# Patient Record
Sex: Female | Born: 1960 | Race: White | Hispanic: No | Marital: Single
Health system: Southern US, Community
[De-identification: ages and names within clinical notes are randomized; demographics above are authoritative.]

## PROBLEM LIST (undated history)

## (undated) DIAGNOSIS — R760 Raised antibody titer: Secondary | ICD-10-CM

## (undated) DIAGNOSIS — Z9889 Other specified postprocedural states: Secondary | ICD-10-CM

## (undated) DIAGNOSIS — K760 Fatty (change of) liver, not elsewhere classified: Secondary | ICD-10-CM

## (undated) DIAGNOSIS — R600 Localized edema: Secondary | ICD-10-CM

## (undated) DIAGNOSIS — I10 Essential (primary) hypertension: Secondary | ICD-10-CM

## (undated) DIAGNOSIS — R112 Nausea with vomiting, unspecified: Secondary | ICD-10-CM

## (undated) DIAGNOSIS — D179 Benign lipomatous neoplasm, unspecified: Secondary | ICD-10-CM

## (undated) DIAGNOSIS — E7800 Pure hypercholesterolemia, unspecified: Secondary | ICD-10-CM

## (undated) DIAGNOSIS — C801 Malignant (primary) neoplasm, unspecified: Secondary | ICD-10-CM

## (undated) DIAGNOSIS — K219 Gastro-esophageal reflux disease without esophagitis: Secondary | ICD-10-CM

## (undated) HISTORY — PX: ABDOMINAL HYSTERECTOMY: SHX81

## (undated) HISTORY — PX: BREAST EXCISIONAL BIOPSY: SUR124

## (undated) HISTORY — PX: CATARACT EXTRACTION: SUR2

---

## 2010-05-12 ENCOUNTER — Ambulatory Visit: Payer: Self-pay | Admitting: Family Medicine

## 2010-12-28 ENCOUNTER — Ambulatory Visit: Payer: Self-pay | Admitting: Family Medicine

## 2012-12-17 ENCOUNTER — Ambulatory Visit: Payer: Self-pay | Admitting: Family Medicine

## 2013-12-11 ENCOUNTER — Ambulatory Visit: Payer: Self-pay | Admitting: Family Medicine

## 2015-01-14 ENCOUNTER — Ambulatory Visit
Admit: 2015-01-14 | Disposition: A | Discharge status: Home / Self Care | Payer: Self-pay | Attending: Family Medicine | Admitting: Family Medicine

## 2015-09-02 ENCOUNTER — Other Ambulatory Visit: Payer: Self-pay | Admitting: Family Medicine

## 2015-09-02 DIAGNOSIS — Z1231 Encounter for screening mammogram for malignant neoplasm of breast: Secondary | ICD-10-CM

## 2015-09-15 ENCOUNTER — Ambulatory Visit
Admission: RE | Admit: 2015-09-15 | Discharge: 2015-09-15 | Disposition: A | Discharge status: Home / Self Care | Payer: BLUE CROSS/BLUE SHIELD | Source: Ambulatory Visit | Attending: Family Medicine | Admitting: Family Medicine

## 2015-09-15 DIAGNOSIS — Z1231 Encounter for screening mammogram for malignant neoplasm of breast: Secondary | ICD-10-CM | POA: Insufficient documentation

## 2015-09-15 HISTORY — DX: Malignant (primary) neoplasm, unspecified: C80.1

## 2017-05-02 ENCOUNTER — Other Ambulatory Visit: Payer: Self-pay | Admitting: Family Medicine

## 2017-05-02 DIAGNOSIS — Z1231 Encounter for screening mammogram for malignant neoplasm of breast: Secondary | ICD-10-CM

## 2017-05-16 ENCOUNTER — Ambulatory Visit
Admission: RE | Admit: 2017-05-16 | Discharge: 2017-05-16 | Disposition: A | Discharge status: Home / Self Care | Payer: BLUE CROSS/BLUE SHIELD | Source: Ambulatory Visit | Attending: Family Medicine | Admitting: Family Medicine

## 2017-05-16 DIAGNOSIS — Z1231 Encounter for screening mammogram for malignant neoplasm of breast: Secondary | ICD-10-CM | POA: Diagnosis present

## 2018-10-16 ENCOUNTER — Other Ambulatory Visit: Payer: Self-pay | Admitting: Family Medicine

## 2018-10-16 DIAGNOSIS — Z1231 Encounter for screening mammogram for malignant neoplasm of breast: Secondary | ICD-10-CM

## 2018-11-06 ENCOUNTER — Ambulatory Visit
Admission: RE | Admit: 2018-11-06 | Discharge: 2018-11-06 | Disposition: A | Discharge status: Home / Self Care | Payer: BLUE CROSS/BLUE SHIELD | Source: Ambulatory Visit | Attending: Family Medicine | Admitting: Family Medicine

## 2018-11-06 DIAGNOSIS — Z1231 Encounter for screening mammogram for malignant neoplasm of breast: Secondary | ICD-10-CM | POA: Insufficient documentation

## 2019-11-12 ENCOUNTER — Other Ambulatory Visit: Payer: Self-pay | Admitting: Family Medicine

## 2019-11-12 DIAGNOSIS — Z1231 Encounter for screening mammogram for malignant neoplasm of breast: Secondary | ICD-10-CM

## 2019-11-28 ENCOUNTER — Ambulatory Visit
Admission: RE | Admit: 2019-11-28 | Discharge: 2019-11-28 | Disposition: A | Discharge status: Home / Self Care | Payer: BC Managed Care – PPO | Source: Ambulatory Visit | Attending: Family Medicine | Admitting: Family Medicine

## 2019-11-28 ENCOUNTER — Other Ambulatory Visit: Payer: Self-pay

## 2019-11-28 DIAGNOSIS — Z1231 Encounter for screening mammogram for malignant neoplasm of breast: Secondary | ICD-10-CM | POA: Diagnosis present

## 2020-01-08 ENCOUNTER — Ambulatory Visit: Payer: BC Managed Care – PPO | Attending: Internal Medicine

## 2020-01-08 DIAGNOSIS — Z23 Encounter for immunization: Secondary | ICD-10-CM

## 2020-01-08 NOTE — Progress Notes (Signed)
   Covid-19 Vaccination Clinic  Name:  Breylynn Haid    MRN: [id number] DOB: 01-19-1961  01/08/2020  [name] was observed post Covid-19 immunization for 15 minutes without incident. She was provided with Vaccine Information Sheet and instruction to access the V-Safe system.   [name] was instructed to call 911 with any severe reactions post vaccine: Marland Kitchen Difficulty breathing  . Swelling of face and throat  . A fast heartbeat  . A bad rash all over body  . Dizziness and weakness   Immunizations Administered    Name Date Dose VIS Date Route   Moderna COVID-19 Vaccine 01/08/2020  2:29 PM 0.5 mL 09/03/2019 Intramuscular   Manufacturer: Moderna   Lot: [id number]   [id number]

## 2020-02-05 ENCOUNTER — Ambulatory Visit: Payer: Self-pay | Attending: Internal Medicine

## 2020-02-05 DIAGNOSIS — Z23 Encounter for immunization: Secondary | ICD-10-CM

## 2020-02-05 NOTE — Progress Notes (Signed)
   Covid-19 Vaccination Clinic  Name:  Misty Mcknight    MRN: [id number] DOB: 06-16-1961  02/05/2020  [name] was observed post Covid-19 immunization for 15 minutes without incident. She was provided with Vaccine Information Sheet and instruction to access the V-Safe system.   [name] was instructed to call 911 with any severe reactions post vaccine: Marland Kitchen Difficulty breathing  . Swelling of face and throat  . A fast heartbeat  . A bad rash all over body  . Dizziness and weakness   Immunizations Administered    Name Date Dose VIS Date Route   Moderna COVID-19 Vaccine 02/05/2020 11:12 AM 0.5 mL 09/2019 Intramuscular   Manufacturer: Moderna   Lot: [id number]   [id number]

## 2020-10-28 ENCOUNTER — Other Ambulatory Visit: Payer: Self-pay | Admitting: Internal Medicine

## 2020-11-20 ENCOUNTER — Other Ambulatory Visit: Payer: Self-pay | Admitting: Family Medicine

## 2020-11-20 DIAGNOSIS — Z1231 Encounter for screening mammogram for malignant neoplasm of breast: Secondary | ICD-10-CM

## 2020-12-16 ENCOUNTER — Other Ambulatory Visit: Payer: Self-pay

## 2020-12-16 ENCOUNTER — Ambulatory Visit
Admission: RE | Admit: 2020-12-16 | Discharge: 2020-12-16 | Disposition: A | Discharge status: Home / Self Care | Payer: BC Managed Care – PPO | Source: Ambulatory Visit | Attending: Family Medicine | Admitting: Family Medicine

## 2020-12-16 DIAGNOSIS — Z1231 Encounter for screening mammogram for malignant neoplasm of breast: Secondary | ICD-10-CM | POA: Insufficient documentation

## 2021-05-02 IMAGING — MG MM DIGITAL SCREENING BILAT W/ TOMO AND CAD
8 series · 9 of 24 positions shown · non-contrast
Comparison: Previous exam(s).

CLINICAL DATA: Screening.

EXAM:
DIGITAL SCREENING BILATERAL MAMMOGRAM WITH TOMOSYNTHESIS AND CAD
TECHNIQUE: Bilateral screening digital craniocaudal and mediolateral oblique
mammograms were obtained. Bilateral screening digital breast
tomosynthesis was performed. The images were evaluated with
computer-aided detection.

[R MLO synth-2D]
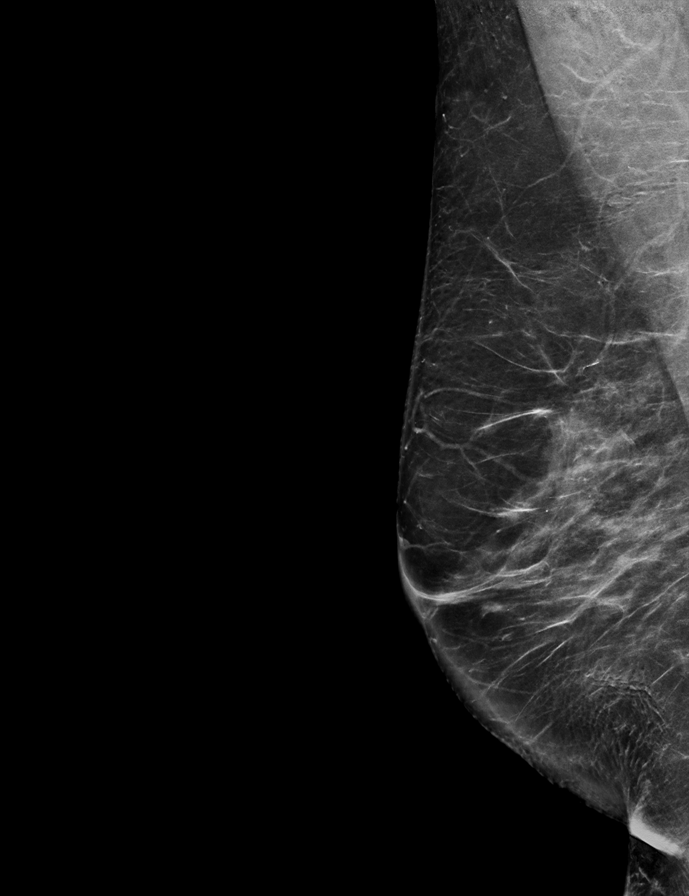

[R CC synth-2D]
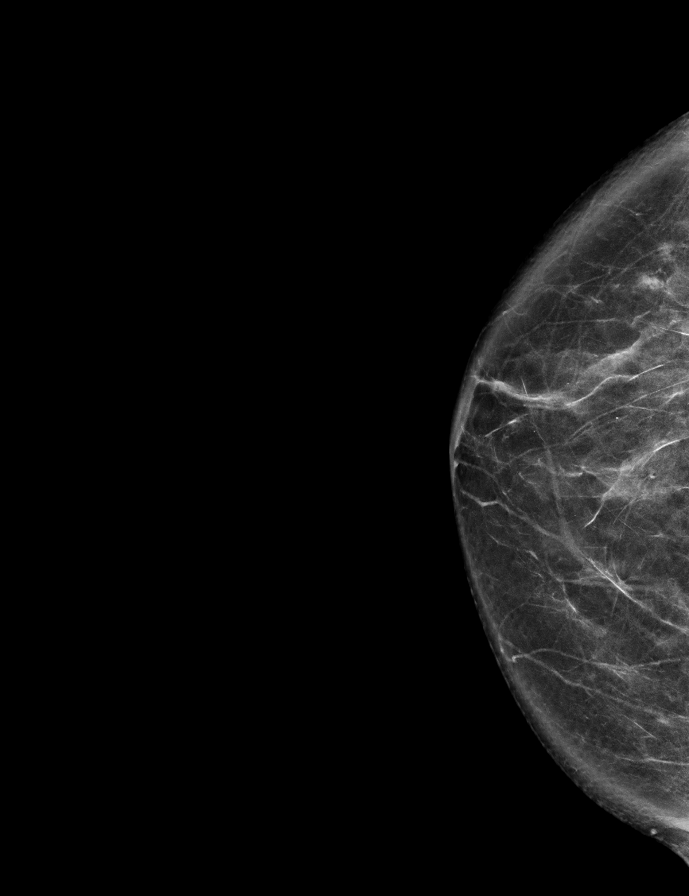

[L MLO synth-2D]
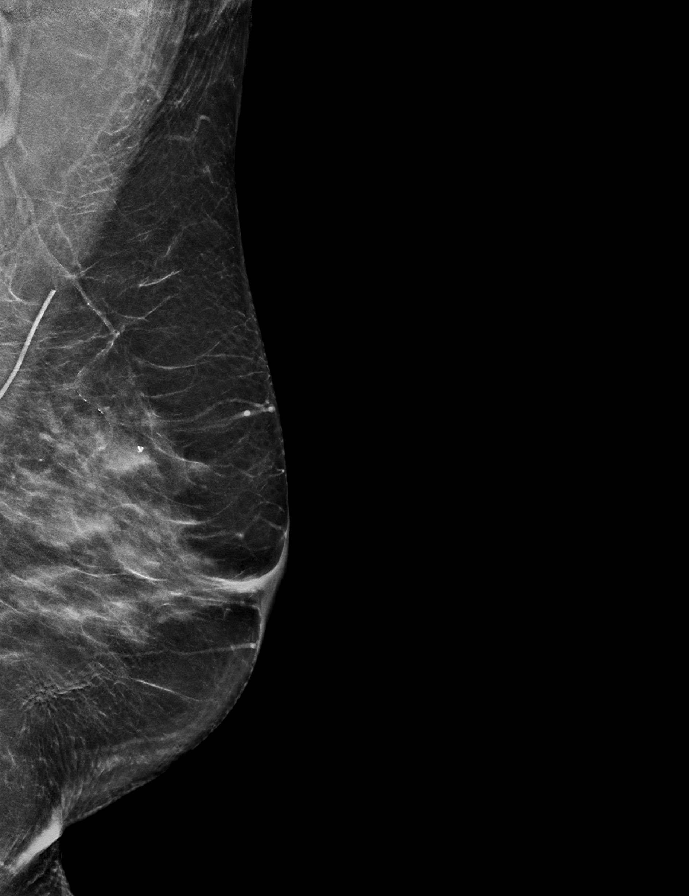

[L CC synth-2D]
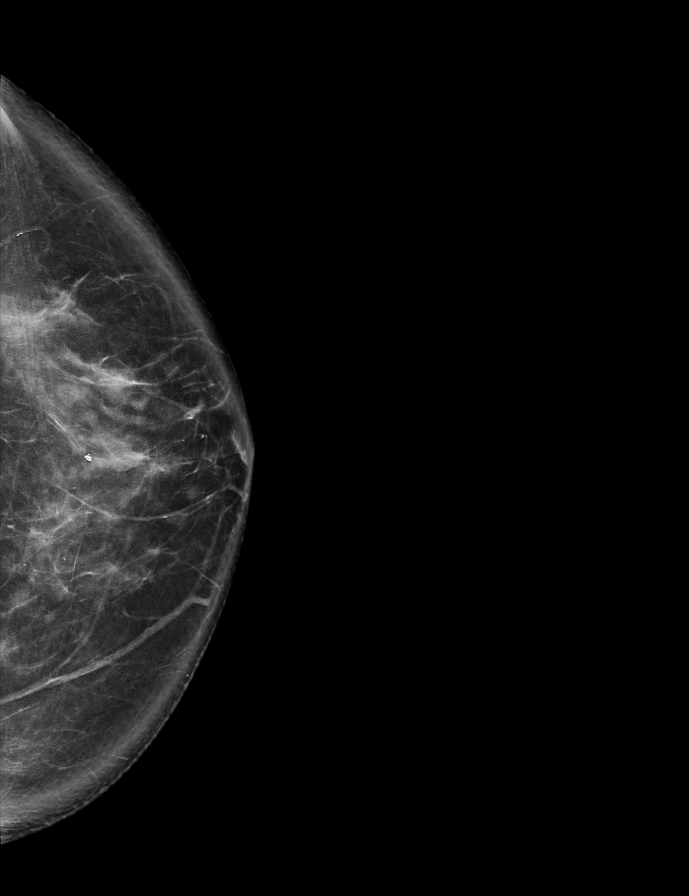

[R CC tomo · 2 of 77 frames shown]
[frame 25/77]
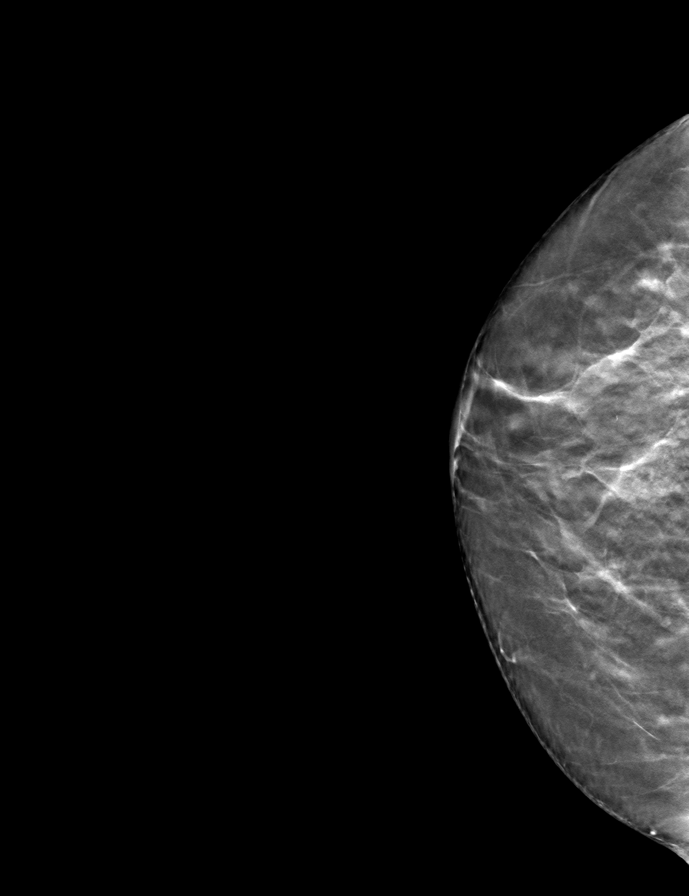
[frame 39/77]
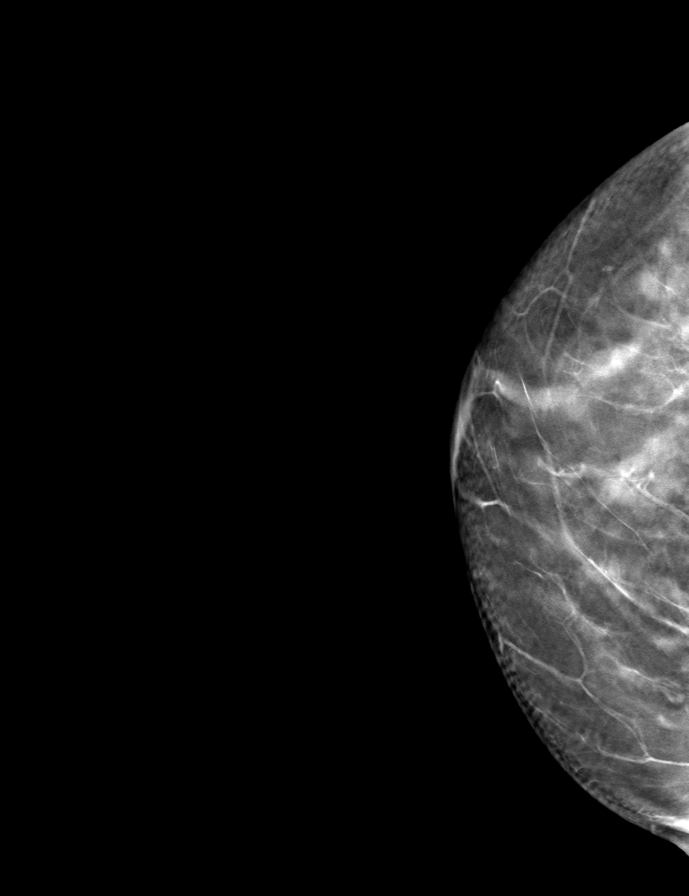

[R MLO tomo · tomo slice 40/79.0]
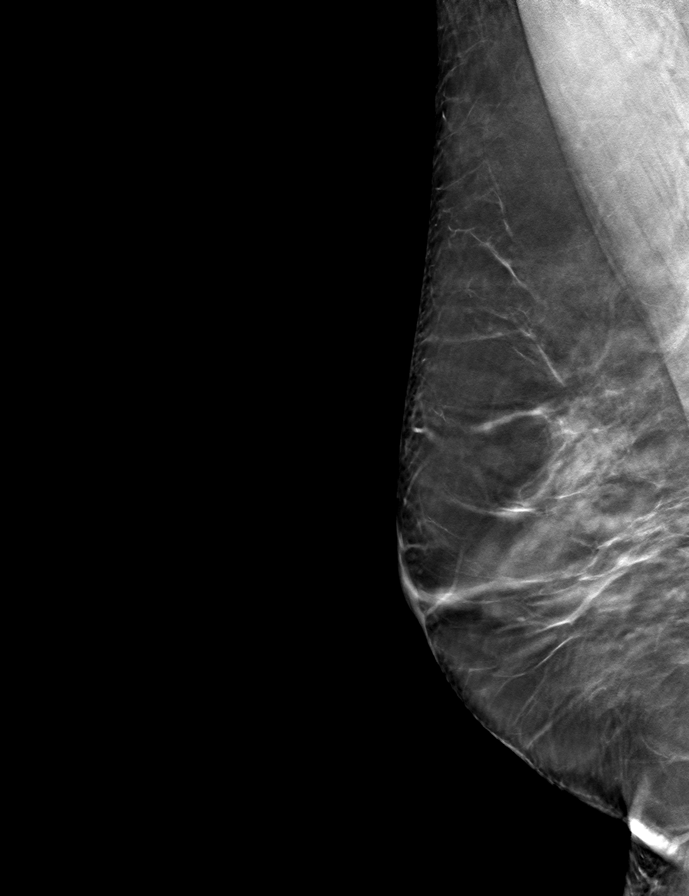

[L CC tomo · tomo slice 40/79.0]
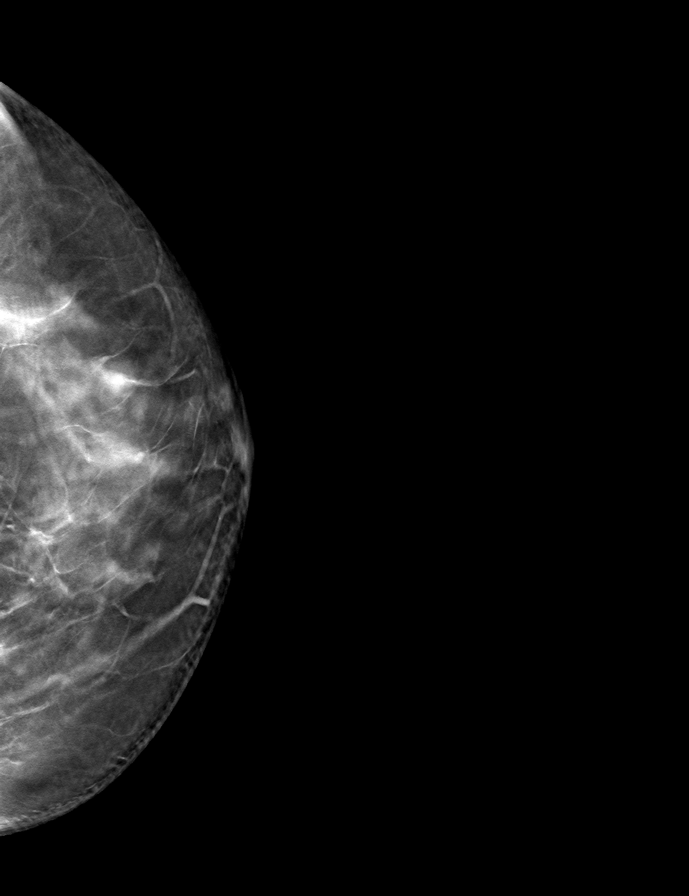

[L MLO tomo · tomo slice 33/66.0]
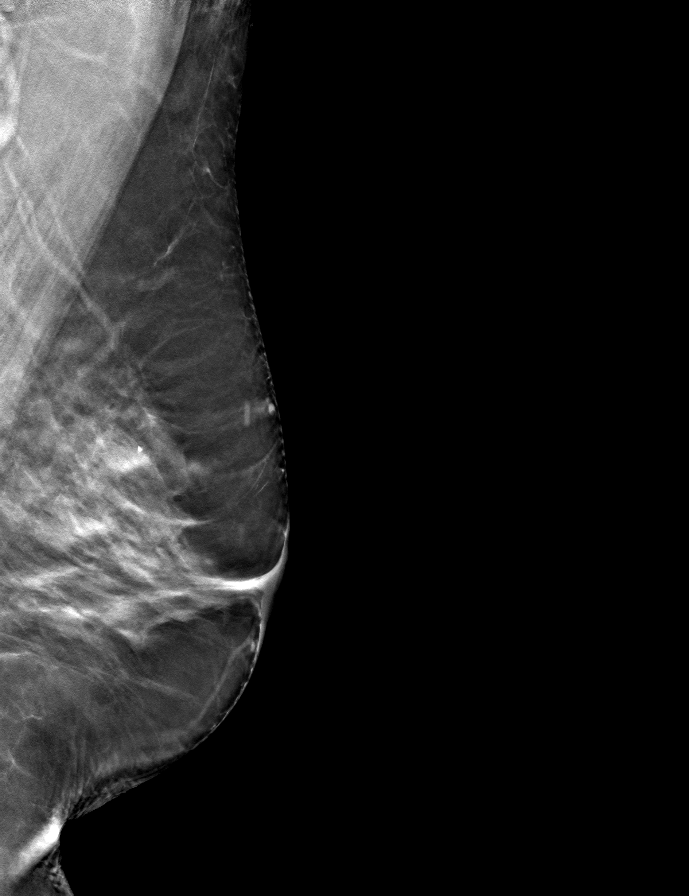

[9 of 24 positions shown; findings below may reference images not displayed]

ACR Breast Density Category c: The breast tissue is heterogeneously
dense, which may obscure small masses.
FINDINGS: There are no findings suspicious for malignancy. The images were
evaluated with computer-aided detection.
IMPRESSION: No mammographic evidence of malignancy. A result letter of this
screening mammogram will be mailed directly to the patient.

RECOMMENDATION:
Screening mammogram in one year. (Code:T4-5-GWO)

BI-RADS CATEGORY  1: Negative.

## 2021-12-14 ENCOUNTER — Other Ambulatory Visit: Payer: Self-pay | Admitting: Family Medicine

## 2021-12-14 DIAGNOSIS — Z1231 Encounter for screening mammogram for malignant neoplasm of breast: Secondary | ICD-10-CM

## 2021-12-27 ENCOUNTER — Encounter: Payer: Self-pay | Admitting: Otolaryngology

## 2022-01-06 ENCOUNTER — Ambulatory Visit: Payer: BC Managed Care – PPO | Admitting: Anesthesiology

## 2022-01-06 ENCOUNTER — Encounter
Admission: RE | Disposition: A | Discharge status: Home / Self Care | Payer: Self-pay | Source: Home / Self Care | Attending: Otolaryngology

## 2022-01-06 ENCOUNTER — Encounter: Payer: Self-pay | Admitting: Otolaryngology

## 2022-01-06 ENCOUNTER — Ambulatory Visit
Admission: RE | Admit: 2022-01-06 | Discharge: 2022-01-06 | Disposition: A | Discharge status: Home / Self Care | Payer: BC Managed Care – PPO | Attending: Otolaryngology | Admitting: Otolaryngology

## 2022-01-06 ENCOUNTER — Other Ambulatory Visit: Payer: Self-pay

## 2022-01-06 DIAGNOSIS — D751 Secondary polycythemia: Secondary | ICD-10-CM | POA: Diagnosis not present

## 2022-01-06 DIAGNOSIS — D2322 Other benign neoplasm of skin of left ear and external auricular canal: Secondary | ICD-10-CM | POA: Insufficient documentation

## 2022-01-06 DIAGNOSIS — I10 Essential (primary) hypertension: Secondary | ICD-10-CM | POA: Diagnosis not present

## 2022-01-06 DIAGNOSIS — H938X2 Other specified disorders of left ear: Secondary | ICD-10-CM | POA: Diagnosis present

## 2022-01-06 HISTORY — DX: Nausea with vomiting, unspecified: R11.2

## 2022-01-06 HISTORY — DX: Raised antibody titer: R76.0

## 2022-01-06 HISTORY — DX: Fatty (change of) liver, not elsewhere classified: K76.0

## 2022-01-06 HISTORY — DX: Gastro-esophageal reflux disease without esophagitis: K21.9

## 2022-01-06 HISTORY — DX: Other specified postprocedural states: Z98.890

## 2022-01-06 HISTORY — DX: Localized edema: R60.0

## 2022-01-06 HISTORY — PX: MINOR EXCISION EAR CANAL MASS: SHX6241

## 2022-01-06 HISTORY — DX: Essential (primary) hypertension: I10

## 2022-01-06 HISTORY — DX: Pure hypercholesterolemia, unspecified: E78.00

## 2022-01-06 HISTORY — DX: Benign lipomatous neoplasm, unspecified: D17.9

## 2022-01-06 SURGERY — MINOR EXCISION EAR CANAL MASS
Anesthesia: General | Site: Ear | Laterality: Left

## 2022-01-06 MED ORDER — DEXAMETHASONE SODIUM PHOSPHATE 10 MG/ML IJ SOLN
INTRAMUSCULAR | Status: DC | PRN
  Administered 2022-01-06: 4 mg via INTRAVENOUS

## 2022-01-06 MED ORDER — MIDAZOLAM HCL 2 MG/2ML IJ SOLN
INTRAMUSCULAR | Status: DC | PRN
  Administered 2022-01-06: 2 mg via INTRAVENOUS

## 2022-01-06 MED ORDER — LIDOCAINE HCL (CARDIAC) PF 100 MG/5ML IV SOSY
PREFILLED_SYRINGE | INTRAVENOUS | Status: DC | PRN
  Administered 2022-01-06: 50 mg via INTRAVENOUS

## 2022-01-06 MED ORDER — LACTATED RINGERS IV SOLN: INTRAVENOUS | Status: DC

## 2022-01-06 MED ORDER — OXYCODONE HCL 5 MG PO TABS: 5.0000 mg | ORAL_TABLET | Freq: Once | ORAL | Status: DC | PRN

## 2022-01-06 MED ORDER — HYDROMORPHONE HCL 1 MG/ML IJ SOLN: 0.2500 mg | INTRAMUSCULAR | Status: DC | PRN

## 2022-01-06 MED ORDER — LIDOCAINE-EPINEPHRINE 1 %-1:100000 IJ SOLN
INTRAMUSCULAR | Status: DC | PRN
  Administered 2022-01-06: .5 mL

## 2022-01-06 MED ORDER — ONDANSETRON HCL 4 MG/2ML IJ SOLN
INTRAMUSCULAR | Status: DC | PRN
  Administered 2022-01-06: 4 mg via INTRAVENOUS

## 2022-01-06 MED ORDER — HYDROCODONE-ACETAMINOPHEN 5-325 MG PO TABS: 1.0000 | ORAL_TABLET | Freq: Four times a day (QID) | ORAL | 0 refills | Status: AC | PRN

## 2022-01-06 MED ORDER — OXYCODONE HCL 5 MG/5ML PO SOLN: 5.0000 mg | Freq: Once | ORAL | Status: DC | PRN

## 2022-01-06 MED ORDER — FENTANYL CITRATE (PF) 100 MCG/2ML IJ SOLN
INTRAMUSCULAR | Status: DC | PRN
  Administered 2022-01-06: 50 ug via INTRAVENOUS

## 2022-01-06 MED ORDER — PROPOFOL 10 MG/ML IV BOLUS
INTRAVENOUS | Status: DC | PRN
  Administered 2022-01-06 (×2): 20 mg via INTRAVENOUS
  Administered 2022-01-06: 60 mg via INTRAVENOUS
  Administered 2022-01-06 (×4): 20 mg via INTRAVENOUS

## 2022-01-06 SURGICAL SUPPLY — 31 items
BALL CTTN LRG ABS STRL LF (GAUZE/BANDAGES/DRESSINGS) ×1
BLADE SURG 15 STRL LF DISP TIS (BLADE) IMPLANT
BLADE SURG 15 STRL SS (BLADE)
CORD BIP STRL DISP 12FT (MISCELLANEOUS) IMPLANT
COTTONBALL LRG STERILE PKG (GAUZE/BANDAGES/DRESSINGS) ×1 IMPLANT
DRSG EMULSION OIL 3X3 NADH (GAUZE/BANDAGES/DRESSINGS) ×1 IMPLANT
DRSG TELFA 4X3 1S NADH ST (GAUZE/BANDAGES/DRESSINGS) IMPLANT
ELECT CAUTERY BLADE TIP 2.5 (TIP)
ELECT REM PT RETURN 9FT ADLT (ELECTROSURGICAL) ×2
ELECTRODE CAUTERY BLDE TIP 2.5 (TIP) IMPLANT
ELECTRODE REM PT RTRN 9FT ADLT (ELECTROSURGICAL) ×1 IMPLANT
GAUZE SPONGE 4X4 12PLY STRL (GAUZE/BANDAGES/DRESSINGS) IMPLANT
GLOVE SURG GAMMEX PI TX LF 7.5 (GLOVE) ×4 IMPLANT
GOWN STRL REUS W/ TWL LRG LVL3 (GOWN DISPOSABLE) IMPLANT
GOWN STRL REUS W/TWL LRG LVL3 (GOWN DISPOSABLE)
KIT TURNOVER KIT A (KITS) ×2 IMPLANT
NDL HYPO 25GX1X1/2 BEV (NEEDLE) ×1 IMPLANT
NEEDLE HYPO 25GX1X1/2 BEV (NEEDLE) ×2 IMPLANT
NS IRRIG 500ML POUR BTL (IV SOLUTION) ×2 IMPLANT
PACK ENT CUSTOM (PACKS) ×2 IMPLANT
PENCIL SMOKE EVACUATOR (MISCELLANEOUS) ×2 IMPLANT
SOL PREP PVP 2OZ (MISCELLANEOUS) ×2
SOLUTION PREP PVP 2OZ (MISCELLANEOUS) ×1 IMPLANT
SPONGE KITTNER 5P (MISCELLANEOUS) ×2 IMPLANT
STRAP BODY AND KNEE 60X3 (MISCELLANEOUS) ×2 IMPLANT
SUCTION FRAZIER HANDLE 10FR (MISCELLANEOUS)
SUCTION TUBE FRAZIER 10FR DISP (MISCELLANEOUS) IMPLANT
SUT PROLENE 5 0 P 3 (SUTURE) ×2 IMPLANT
SUT VIC AB 4-0 RB1 27 (SUTURE) ×2
SUT VIC AB 4-0 RB1 27X BRD (SUTURE) ×1 IMPLANT
SYR 10ML LL (SYRINGE) ×2 IMPLANT

## 2022-01-06 NOTE — Transfer of Care (Signed)
Immediate Anesthesia Transfer of Care Note ? ?Patient: Misty Mcknight ? ?Procedure(s) Performed: REMOVAL OF CYST IN LEFT EAR (Left: Ear) ? ?Patient Location: PACU ? ?Anesthesia Type: General ? ?Level of Consciousness: awake, alert  and patient cooperative ? ?Airway and Oxygen Therapy: Patient Spontanous Breathing and Patient connected to supplemental oxygen ? ?Post-op Assessment: Post-op Vital signs reviewed, Patient's Cardiovascular Status Stable, Respiratory Function Stable, Patent Airway and No signs of Nausea or vomiting ? ?Post-op Vital Signs: Reviewed and stable ? ?Complications: No notable events documented. ? ?

## 2022-01-06 NOTE — H&P (Signed)
H&P has been reviewed and patient reevaluated, no changes necessary. To be downloaded later.  

## 2022-01-06 NOTE — Anesthesia Procedure Notes (Signed)
Date/Time: 01/06/2022 9:54 AM ?Performed by: Mayme Genta, CRNA ?Pre-anesthesia Checklist: Patient identified, Emergency Drugs available, Suction available, Timeout performed and Patient being monitored ?Patient Re-evaluated:Patient Re-evaluated prior to induction ?Oxygen Delivery Method: Simple face mask ?Placement Confirmation: positive ETCO2 ? ? ? ? ?

## 2022-01-06 NOTE — Anesthesia Preprocedure Evaluation (Signed)
Anesthesia Evaluation  ?Patient identified by MRN, date of birth, ID band ?Patient awake ? ? ? ?Reviewed: ?Allergy & Precautions, H&P , NPO status , Patient's Chart, lab work & pertinent test results, reviewed documented beta blocker date and time  ? ?History of Anesthesia Complications ?(+) PONV and history of anesthetic complications ? ?Airway ?Mallampati: II ? ?TM Distance: >3 FB ?Neck ROM: full ? ? ? Dental ?no notable dental hx. ? ?  ?Pulmonary ?neg pulmonary ROS,  ?Possible OSA ?  ?Pulmonary exam normal ?breath sounds clear to auscultation ? ? ? ? ? ? Cardiovascular ?Exercise Tolerance: Good ?hypertension, negative cardio ROS ? ? ?Rhythm:regular Rate:Normal ? ? ?  ?Neuro/Psych ?negative neurological ROS ? negative psych ROS  ? GI/Hepatic ?negative GI ROS, Neg liver ROS, GERD  ,NASH ?  ?Endo/Other  ?negative endocrine ROS ? Renal/GU ?negative Renal ROS  ?negative genitourinary ?  ?Musculoskeletal ? ? Abdominal ?  ?Peds ? Hematology ?negative hematology ROS ?(+) polycythemia   ?Anesthesia Other Findings ? ? Reproductive/Obstetrics ?negative OB ROS ? ?  ? ? ? ? ? ? ? ? ? ? ? ? ? ?  ?  ? ? ? ? ? ? ? ? ?Anesthesia Physical ?Anesthesia Plan ? ?ASA: 2 ? ?Anesthesia Plan: General  ? ?Post-op Pain Management:   ? ?Induction:  ? ?PONV Risk Score and Plan: Propofol infusion, TIVA and Treatment may vary due to age or medical condition ? ?Airway Management Planned:  ? ?Additional Equipment:  ? ?Intra-op Plan:  ? ?Post-operative Plan:  ? ?Informed Consent: I have reviewed the patients History and Physical, chart, labs and discussed the procedure including the risks, benefits and alternatives for the proposed anesthesia with the patient or authorized representative who has indicated his/her understanding and acceptance.  ? ? ? ?Dental Advisory Given ? ?Plan Discussed with: CRNA ? ?Anesthesia Plan Comments:   ? ? ? ? ? ? ?Anesthesia Quick Evaluation ? ?

## 2022-01-06 NOTE — Anesthesia Postprocedure Evaluation (Signed)
Anesthesia Post Note ? ?Patient: Misty Mcknight ? ?Procedure(s) Performed: REMOVAL OF CYST IN LEFT EAR (Left: Ear) ? ? ?  ?Patient location during evaluation: PACU ?Anesthesia Type: General ?Level of consciousness: awake and alert ?Pain management: pain level controlled ?Vital Signs Assessment: post-procedure vital signs reviewed and stable ?Respiratory status: spontaneous breathing, nonlabored ventilation and respiratory function stable ?Cardiovascular status: blood pressure returned to baseline and stable ?Postop Assessment: no apparent nausea or vomiting ?Anesthetic complications: no ? ? ?No notable events documented. ? ?April Manson ? ? ? ? ? ?

## 2022-01-06 NOTE — Op Note (Signed)
01/06/2022 ? ?10:11 AM ? ? ? ?Misty, Mcknight ? ?[id number] ?For ? ?Pre-Op Dx: Growth in the left external ear canal ? ?Post-op Dx: Growth in the left external ear canal ? ?Proc: Excision growth/benign lesion in the left external ear canal ? ?Surg:  Huey Romans ? ?Anes: IV sedation with local anesthesia ? ?EBL: 5 mL ? ?Comp: None ? ?Findings: A white solid lesion that was underneath the skin.  It was lying in the subcu layer and likely a cyst.  It was removed completely and intact.  The wound was cauterized but the skin could not be pulled together because of not having enough loose skin in the canal. ? ?Procedure: Patient was brought to the operating room and placed in supine position.  She was given IV sedation.  The left ear canal was visualized under high-power microscope.  The cyst approximately 5 mm in length and maybe 4 mm in width.  It was attached to the inferior wall of the external ear canal just inside the conchal bowl.  1% Xylocaine with epi 1: 100,000 was used for infiltration into the soft tissue around the mass.  1/2 mL of local was used to infiltrate all the way around it.  The area was then prepped with Betadine solution. ? ?Using a nasal speculum to help open up the ear canal area the area could be seen well with high-power magnification.  Using a scalpel an incision was made directly around the mass on its anterior and posterior walls and external area.  A freer elevator was used to elevate this up and get into the soft tissue plane that was beneath the mass.  It was freed up and then scissors were used to incise the skin around its anterior border and free it up from the remaining ear canal.  The mass was completely removed with its overlying skin.  It was sent for permanent section.  The underlying soft tissue had a couple little areas that were oozing and bipolar cautery was used to help control the bleeding here.  There was not enough loose skin to pull this together to close it with a suture, so  the wound was covered with Vaseline gauze and then a cottonball was placed into the ear canal to hold the Vaseline gauze in place. ? ?Patient tolerated the procedure well.  She was awakened and taken to the recovery room in satisfactory condition.  There were no operative complications ? ?Dispo:   To PACU to be discharged home ? ?Plan: To follow-up in the office in 1 week to go over the path report and make sure the ear canal is healing well.  She can use Tylenol or ibuprofen for pain.  We will send a little bit of Vicodin in case she has more pain in the first day or 2. ? ?Huey Romans ? ?01/06/2022 ?10:11 AM  ?

## 2022-01-07 ENCOUNTER — Encounter: Payer: Self-pay | Admitting: Otolaryngology

## 2022-01-07 LAB — SURGICAL PATHOLOGY

## 2022-01-20 ENCOUNTER — Ambulatory Visit
Admission: RE | Admit: 2022-01-20 | Discharge: 2022-01-20 | Disposition: A | Discharge status: Home / Self Care | Payer: BC Managed Care – PPO | Source: Ambulatory Visit | Attending: Family Medicine | Admitting: Family Medicine

## 2022-01-20 DIAGNOSIS — Z1231 Encounter for screening mammogram for malignant neoplasm of breast: Secondary | ICD-10-CM | POA: Insufficient documentation

## 2023-02-01 ENCOUNTER — Other Ambulatory Visit: Payer: Self-pay | Admitting: Physician Assistant

## 2023-02-01 DIAGNOSIS — Z1231 Encounter for screening mammogram for malignant neoplasm of breast: Secondary | ICD-10-CM

## 2023-03-09 ENCOUNTER — Ambulatory Visit
Admission: RE | Admit: 2023-03-09 | Discharge: 2023-03-09 | Disposition: A | Discharge status: Home / Self Care | Payer: BC Managed Care – PPO | Source: Ambulatory Visit | Attending: Physician Assistant | Admitting: Physician Assistant

## 2023-03-09 DIAGNOSIS — Z1231 Encounter for screening mammogram for malignant neoplasm of breast: Secondary | ICD-10-CM | POA: Diagnosis present

## 2024-03-29 ENCOUNTER — Other Ambulatory Visit: Payer: Self-pay | Admitting: Physician Assistant

## 2024-03-29 DIAGNOSIS — Z1231 Encounter for screening mammogram for malignant neoplasm of breast: Secondary | ICD-10-CM

## 2024-04-09 ENCOUNTER — Ambulatory Visit
Admission: RE | Admit: 2024-04-09 | Discharge: 2024-04-09 | Disposition: A | Discharge status: Home / Self Care | Source: Ambulatory Visit | Attending: Physician Assistant | Admitting: Physician Assistant

## 2024-04-09 DIAGNOSIS — Z1231 Encounter for screening mammogram for malignant neoplasm of breast: Secondary | ICD-10-CM | POA: Insufficient documentation

## 2024-04-16 ENCOUNTER — Other Ambulatory Visit: Payer: Self-pay | Admitting: Physician Assistant

## 2024-04-16 DIAGNOSIS — R928 Other abnormal and inconclusive findings on diagnostic imaging of breast: Secondary | ICD-10-CM

## 2024-04-17 ENCOUNTER — Ambulatory Visit
Admission: RE | Admit: 2024-04-17 | Discharge: 2024-04-17 | Disposition: A | Discharge status: Home / Self Care | Source: Ambulatory Visit | Attending: Physician Assistant | Admitting: Physician Assistant

## 2024-04-17 DIAGNOSIS — R928 Other abnormal and inconclusive findings on diagnostic imaging of breast: Secondary | ICD-10-CM | POA: Diagnosis present
